# Patient Record
Sex: Female | Born: 1985 | Race: Black or African American | Hispanic: No | Marital: Single | Smoking: Never smoker
Health system: Southern US, Community
[De-identification: ages and names within clinical notes are randomized; demographics above are authoritative.]

---
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
## 2005-08-03 ENCOUNTER — Ambulatory Visit (HOSPITAL_COMMUNITY): Admission: RE | Admit: 2005-08-03 | Discharge: 2005-08-03 | Payer: Self-pay | Admitting: *Deleted

## 2005-08-21 ENCOUNTER — Ambulatory Visit (HOSPITAL_COMMUNITY): Admission: RE | Admit: 2005-08-21 | Discharge: 2005-08-21 | Payer: Self-pay | Admitting: *Deleted

## 2005-08-21 ENCOUNTER — Ambulatory Visit: Payer: Self-pay | Admitting: *Deleted

## 2005-09-27 IMAGING — US US OB COMP +14 WK
1 series · 13 of 28 positions shown · non-contrast
Comparison: none

CLINICAL DATA: 22 week 0 day gestational age by LMP.   Evaluate dating and anatomy.

[Series 1: us ob comp +14 wk · 0.29mm/px · 13 of 94 slices shown]
[im 4/94]
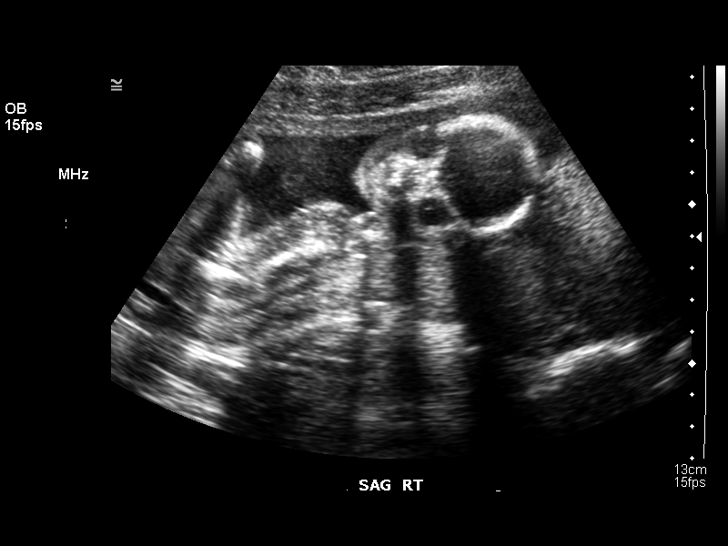
[im 11/94]
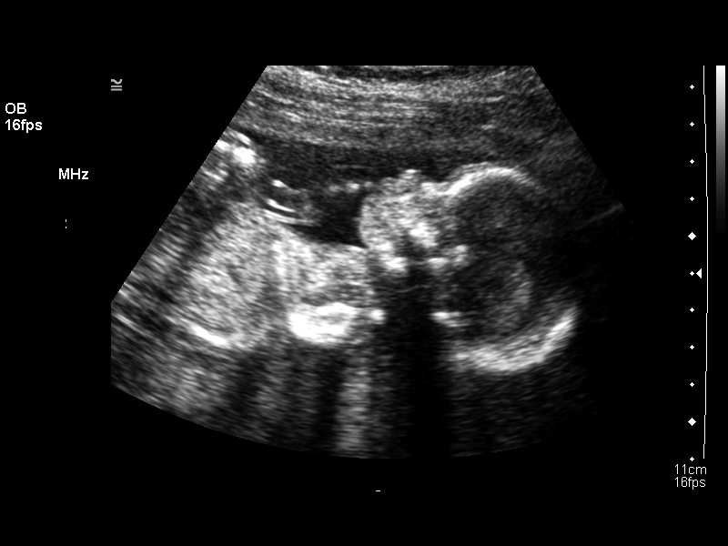
[im 18/94]
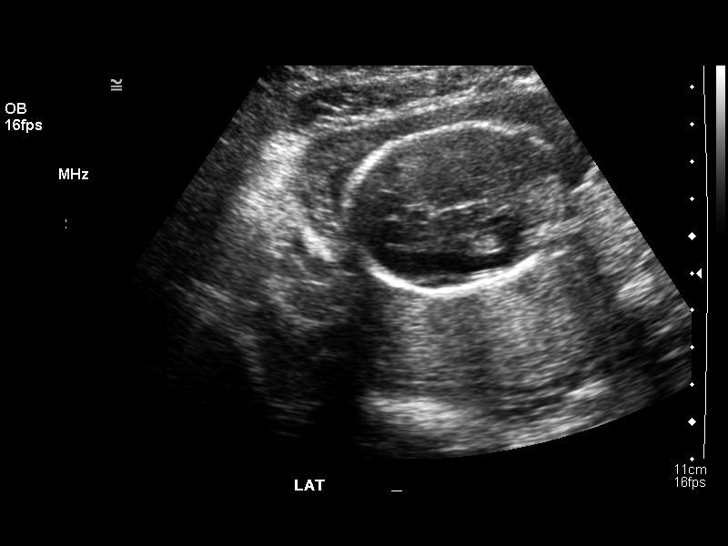
[im 25/94]
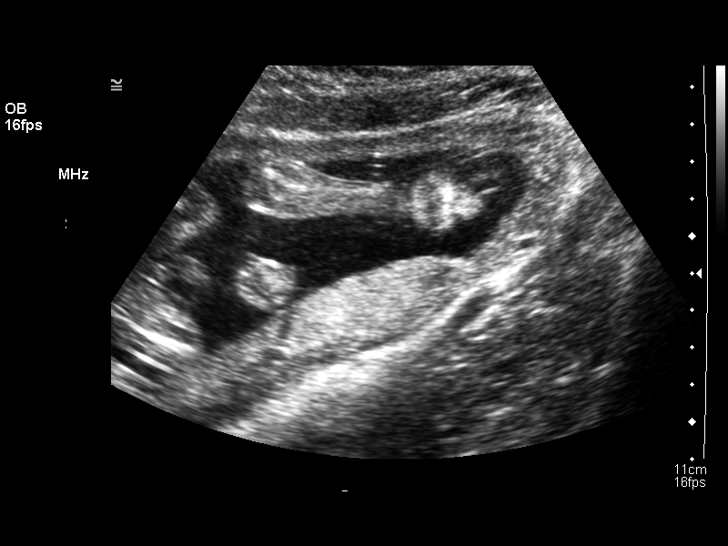
[im 32/94]
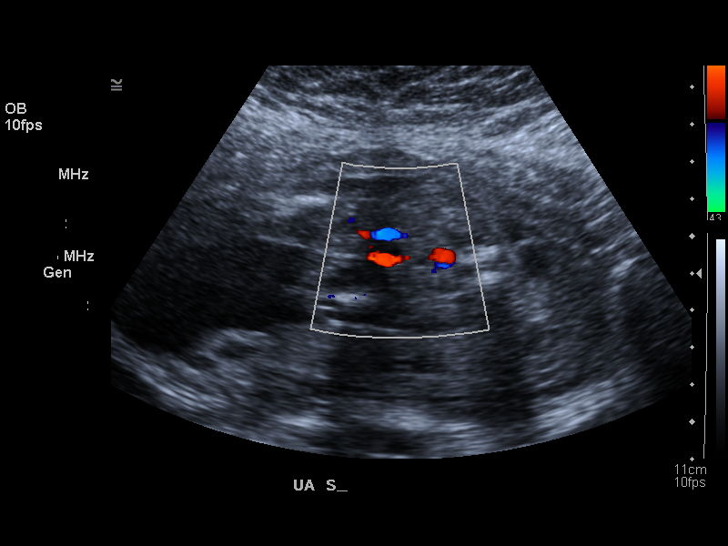
[im 38/94]
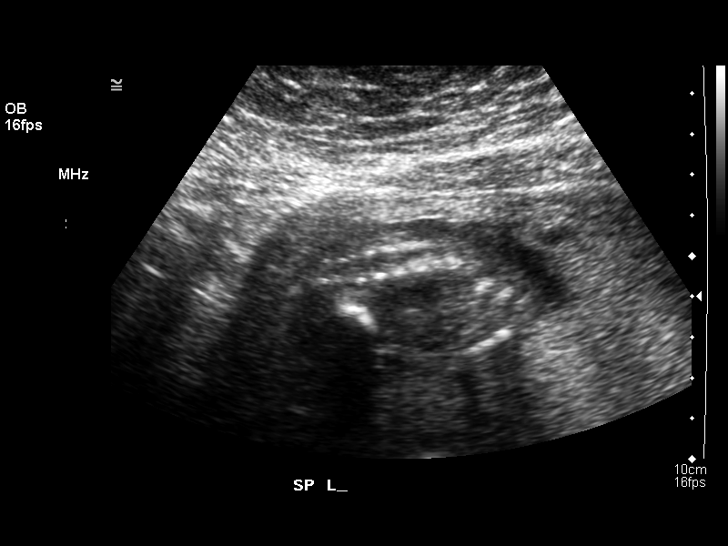
[im 49/94]
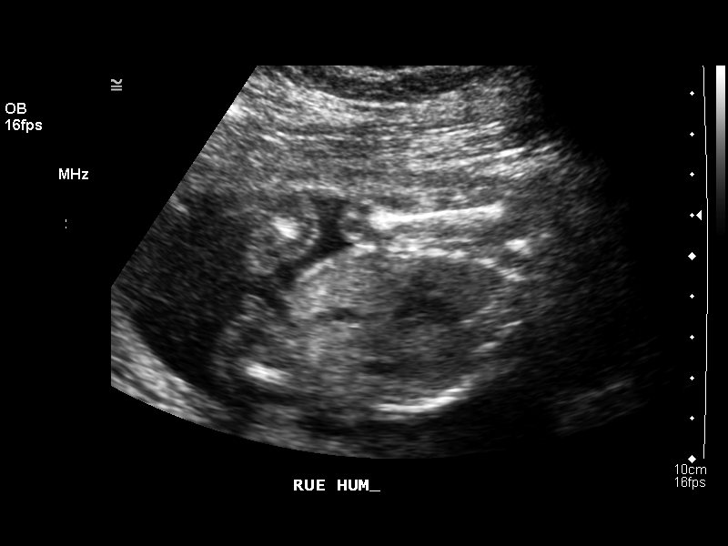
[im 56/94]
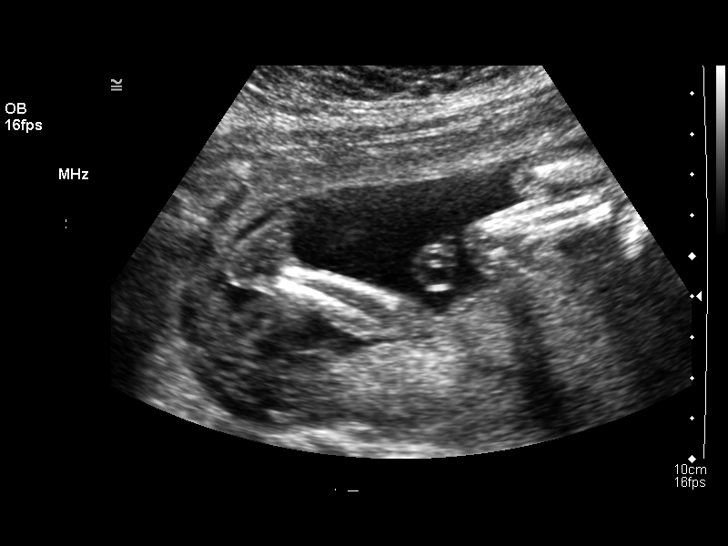
[im 63/94]
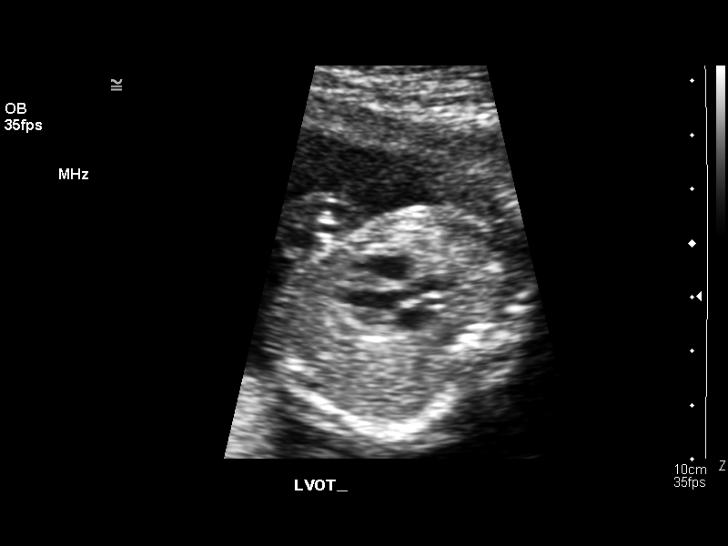
[im 69/94]
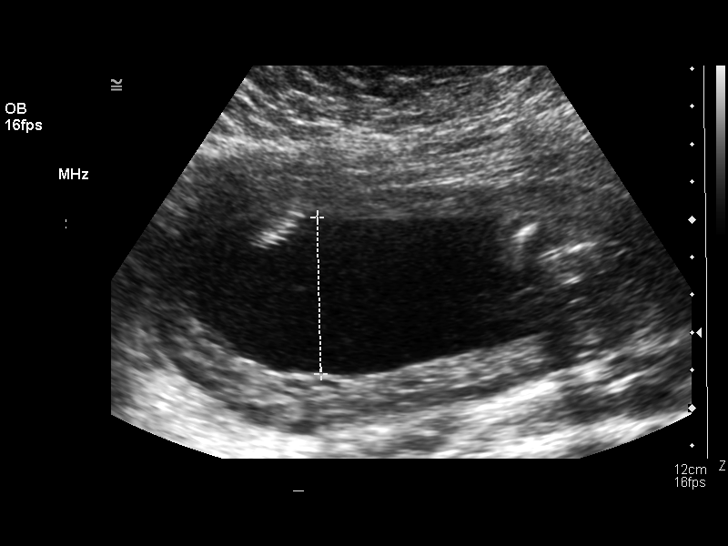
[im 76/94]
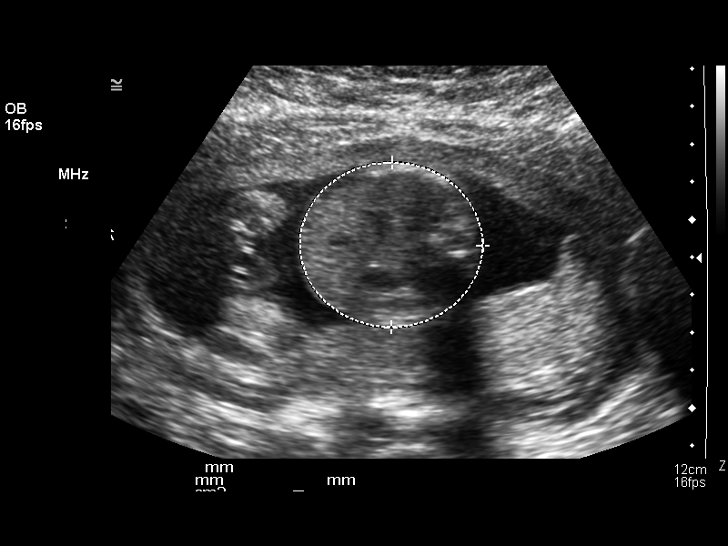
[im 83/94]
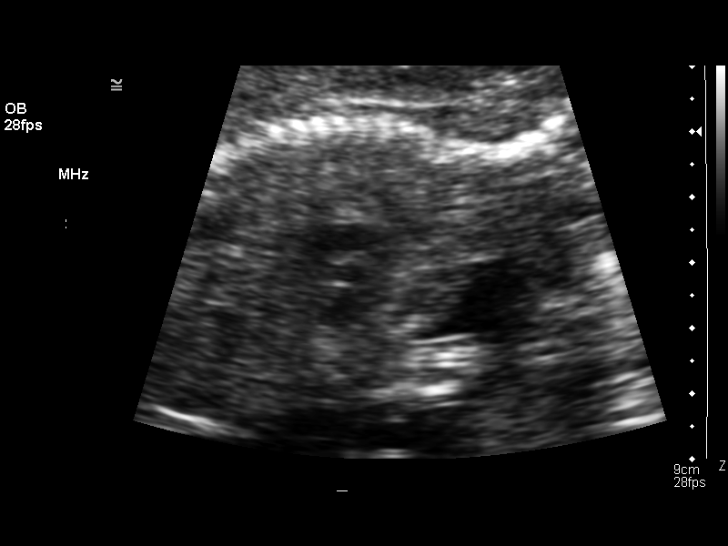
[im 90/94]
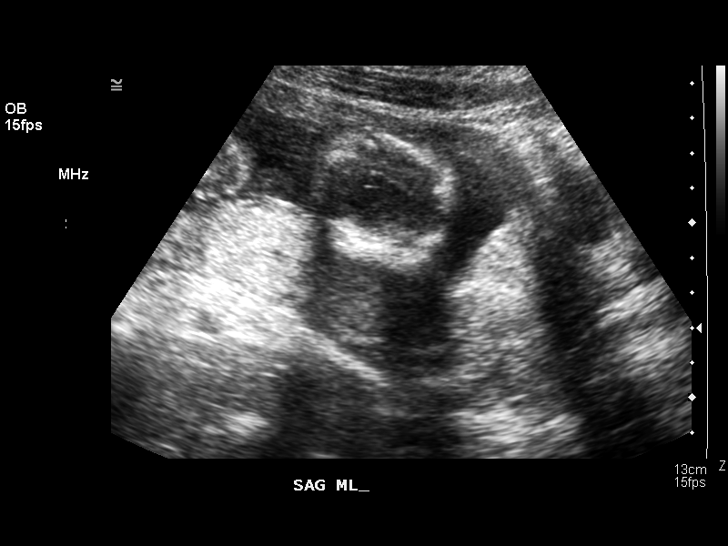

[13 of 28 positions shown; findings below may reference images not displayed]

OBSTETRICAL ULTRASOUND:
Number of Fetuses:  1
Heart Rate:    150 bpm
Movement:  Yes 
Breathing:  
Presentation:  Cephalic 
Placental Location:  Posterior 
Grade:  1
Previa:  No 
Amniotic Fluid (Subjective):  Normal 
Amniotic Fluid (Objective):   Vertical pocket 4.1 cm 

FETAL BIOMETRY
BPD:  4.3 cm  19 w  2 d
HC:  16.8 cm  19 w  3 d
AC:  14.0 cm  19 w  4 d
FL:  3.3 cm  20 w  2 d

MEAN GA:  19 w  5 d 

FETAL ANATOMY
Lateral Ventricles:  Visualized 
Thalami/CSP:  Visualized 
Posterior Fossa:  Visualized 
Nuchal Region:  Visualized 
Spine:  Visualized 
4 Chamber Heart on Left:  Visualized 
Stomach on Left:  Visualized 
3 Vessel Cord:  Visualized 
Cord Insertion site:  Visualized 
Kidneys:  Visualized 
Bladder:  Visualized 
Extremities:  Visualized 

ADDITIONAL ANATOMY VISUALIZED:  LVOT, RVOT, upper lip, orbits, profile, diaphragm, heel, 5th digit, ductal arch, aortic arch, male genitalia and nasal bone

MATERNAL UTERINE AND ADNEXAL FINDINGS
Cervix:   3.7 cm transabdominally 

The right ovary is unremarkable.   Left ovary was not directly visualized but no left adnexal masses are seen.
IMPRESSION: 1.   Single living intrauterine fetus with mean gestational age of 19 weeks 5 days and sonographic EDC of 12/23/05.   This is 2 weeks behind LMP, suggesting LMP is inaccurate.  

2.  No evidence of fetal anatomic abnormality.

## 2005-10-05 ENCOUNTER — Ambulatory Visit (HOSPITAL_COMMUNITY): Admission: RE | Admit: 2005-10-05 | Discharge: 2005-10-05 | Payer: Self-pay | Admitting: *Deleted

## 2005-11-29 IMAGING — US US OB FOLLOW-UP
1 series · 13 of 28 positions shown · non-contrast
Comparison: none

CLINICAL DATA: 28 week 5 day assigned gestational age by prior ultrasound.  Measuring small for dates.  Evaluate fetal growth and amniotic fluid volume.

[Series 1: us ob follow-up · 13 of 54 slices shown]
[im 2/54]
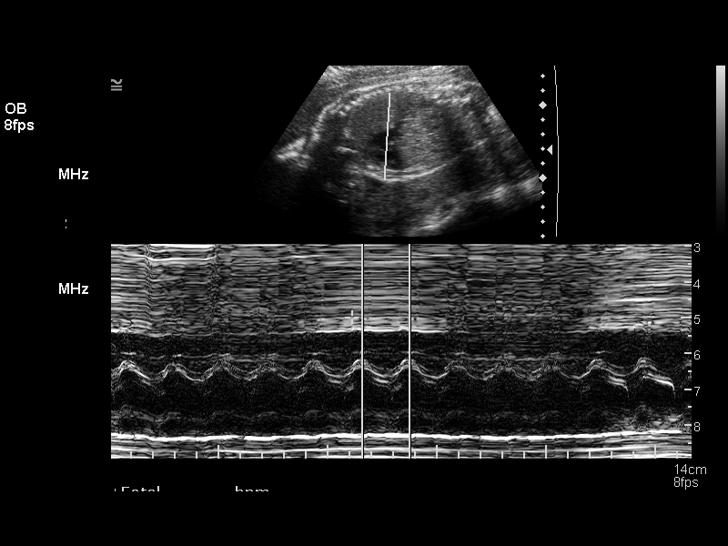
[im 6/54]
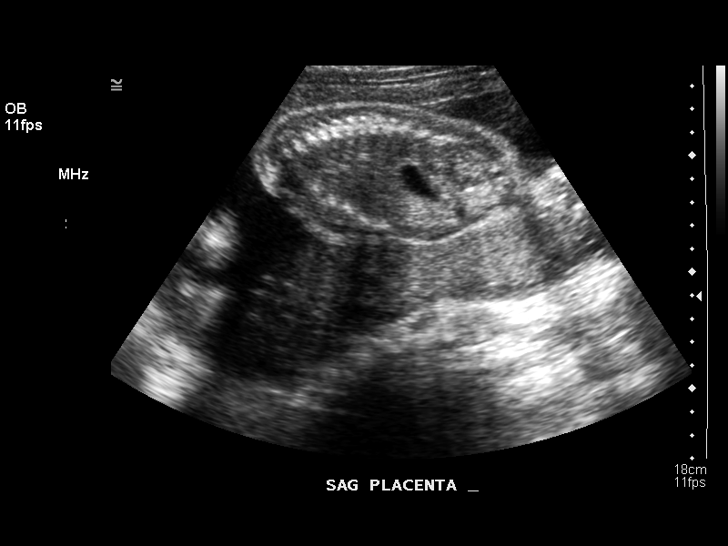
[im 10/54]
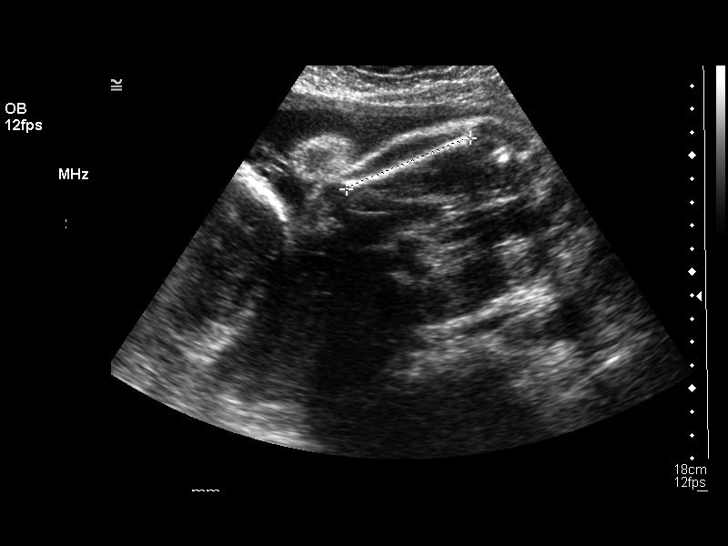
[im 14/54]
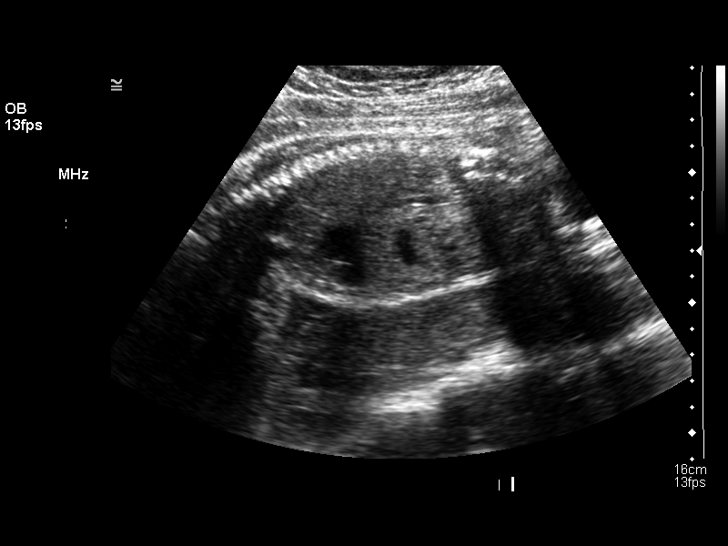
[im 18/54]
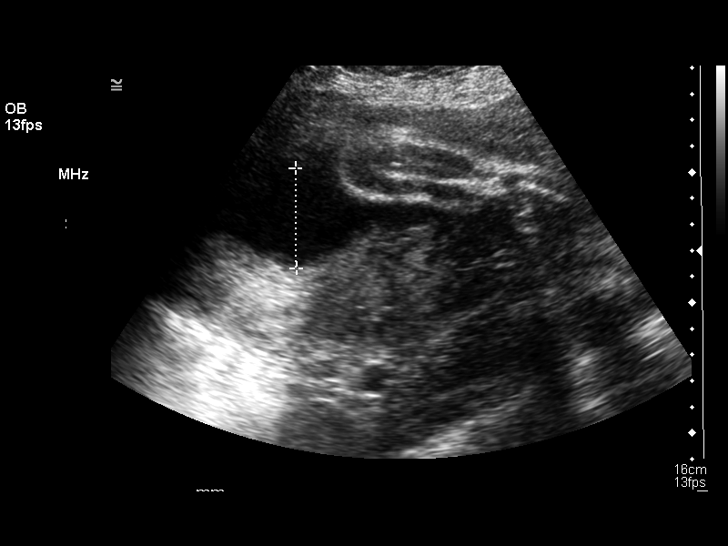
[im 22/54]
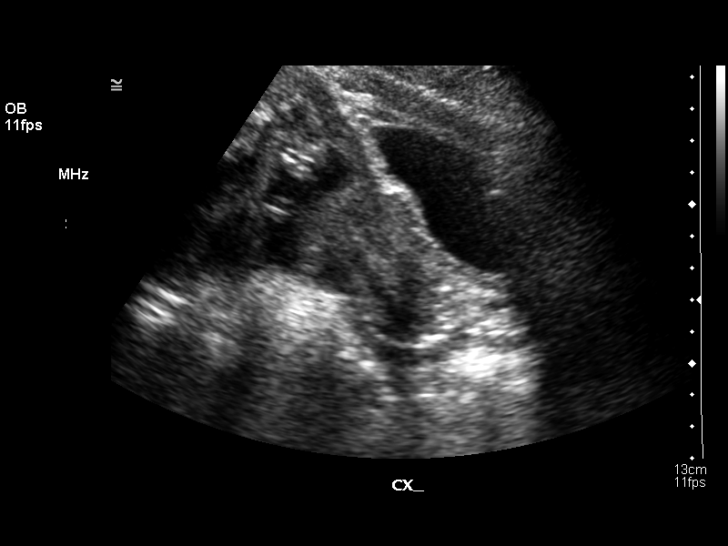
[im 28/54]
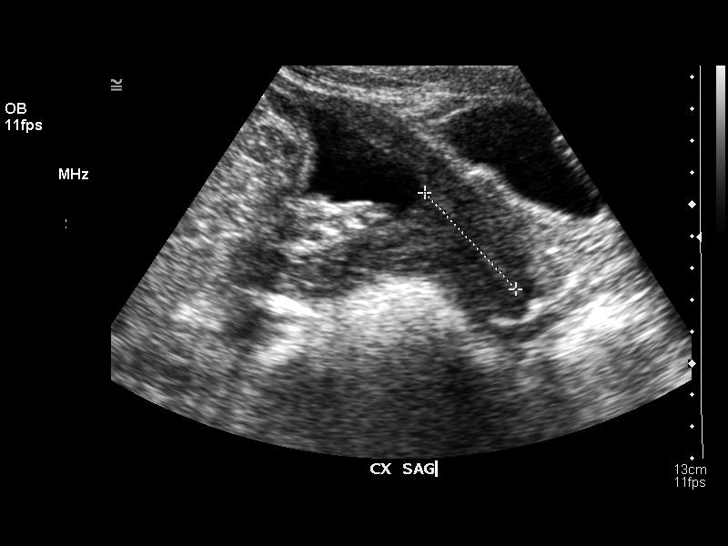
[im 32/54]
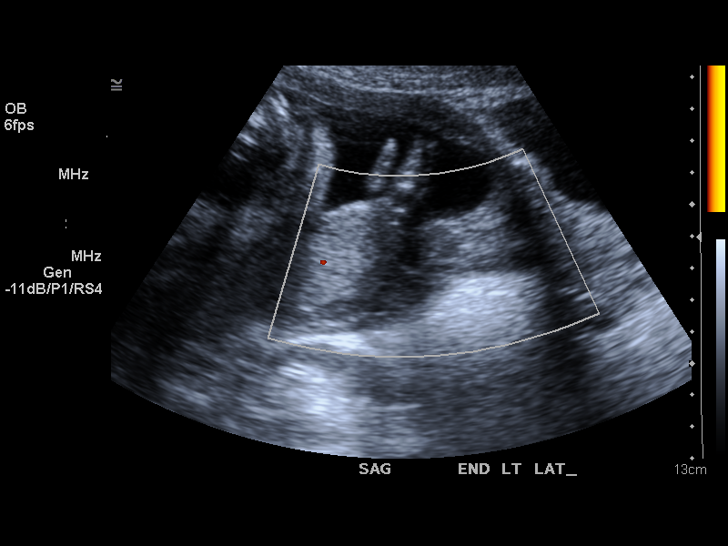
[im 36/54]
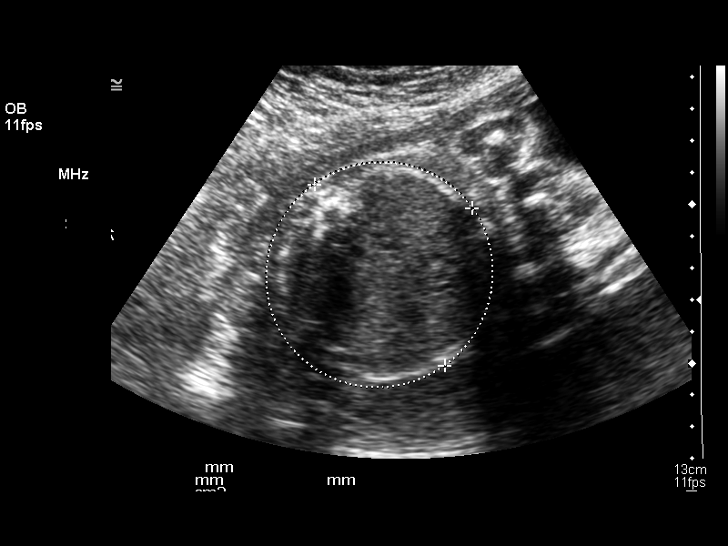
[im 40/54]
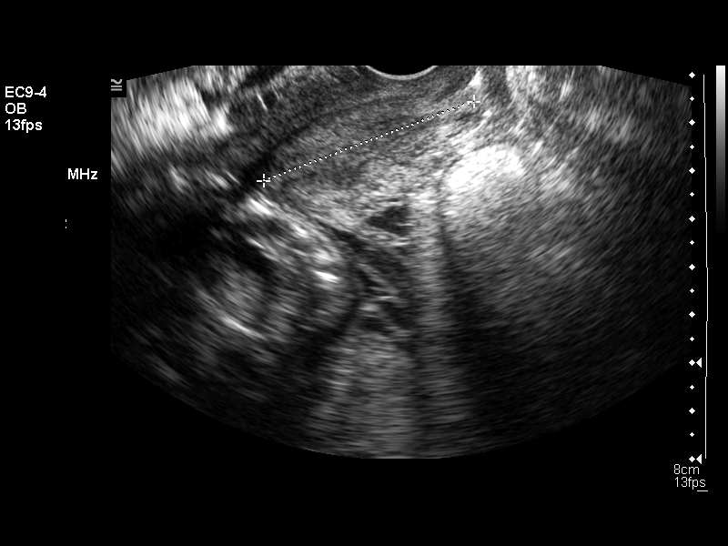
[im 44/54]
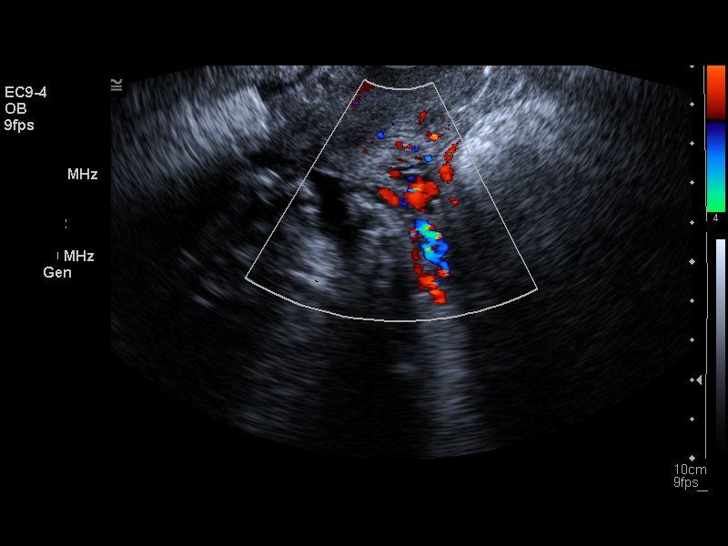
[im 48/54]
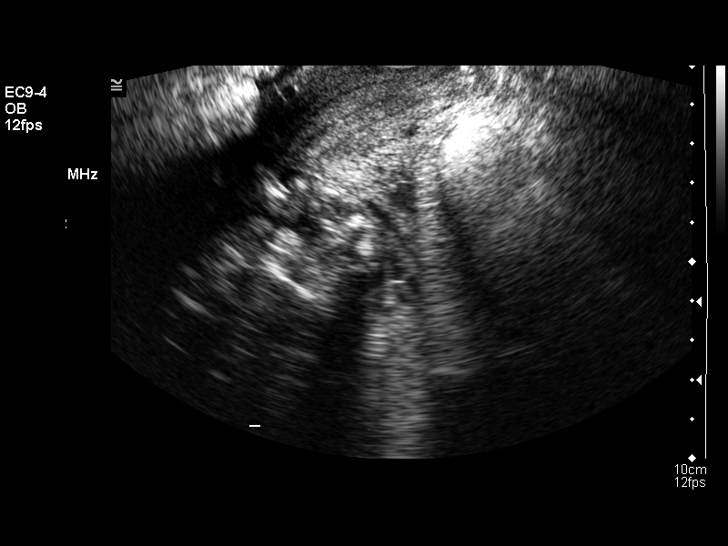
[im 52/54]
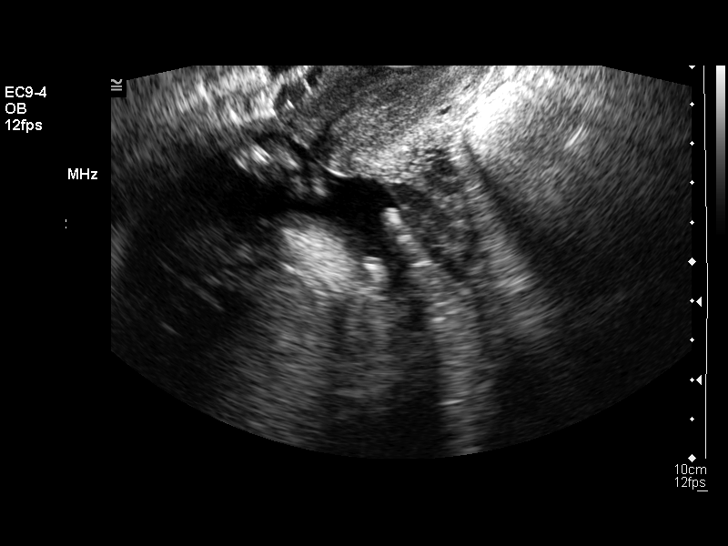

[13 of 28 positions shown; findings below may reference images not displayed]

OBSTETRICAL ULTRASOUND RE-EVALUATION AND TRANSVAGINAL OB US:
 Number of Fetuses:  1
 Heart Rate:  139
 Movement:  Yes
 Breathing:  No
 Presentation:  Breech
 Placental Location:  Posterior
 Grade:  I
 Previa:  No
 Amniotic Fluid (subjective):  Normal
 Amniotic Fluid (objective):  13.3 cm AFI (5th -95th%ile = 9.2 – 23.1 cm for 29 wks)

 FETAL BIOMETRY
 BPD:  7.3 cm   29 w 1 d
 HC:  27.9 cm   30 w 4 d
 AC:  23.1 cm   27 w 3 d
 FL:   5.6 cm  29 w 5 d

 Mean GA:  29 w 2 d
 1st US GA:  28 w 5 d

 EFW:  2272 g (S) 50th – 75th%ile (7726 – 9782 g) For 29 wks

 FETAL ANATOMY
 Lateral Ventricles:  Previously seen 
 Thalami/CSP:  Previously seen 
 Posterior Fossa:  Previously seen 
 Nuchal Region:  Previously seen 
 Spine:  Previously seen 
 4 Chamber Heart on Left:  Previously seen 
 Stomach on Left:  Visualized 
 3 Vessel Cord:  Previously seen 
 Cord Insertion Site:  Previously seen 
 Kidneys:  Visualized 
 Bladder:  Visualized 
 Extremities:  Previously seen 

 MATERNAL UTERINE AND ADNEXAL FINDINGS
 Cervix:  4.2 cm Transvaginally
IMPRESSION: 1.  Assigned gestational age is currently 28 weeks 4 days.  Appropriate fetal growth, with EFW at 50th – 75th percentile.
 2.  Normal amniotic fluid volume.  Normal cervical length.  
 3.  Fetus in breech presentation.

## 2005-12-18 ENCOUNTER — Ambulatory Visit: Payer: Self-pay | Admitting: *Deleted

## 2005-12-18 ENCOUNTER — Inpatient Hospital Stay (HOSPITAL_COMMUNITY): Admission: AD | Admit: 2005-12-18 | Discharge: 2005-12-21 | Payer: Self-pay | Admitting: Family Medicine

## 2005-12-25 ENCOUNTER — Inpatient Hospital Stay (HOSPITAL_COMMUNITY): Admission: AD | Admit: 2005-12-25 | Discharge: 2005-12-25 | Payer: Self-pay | Admitting: Family Medicine

## 2006-01-08 ENCOUNTER — Emergency Department (HOSPITAL_COMMUNITY): Admission: EM | Admit: 2006-01-08 | Discharge: 2006-01-08 | Payer: Self-pay | Admitting: Emergency Medicine

## 2008-04-22 ENCOUNTER — Emergency Department (HOSPITAL_COMMUNITY): Admission: EM | Admit: 2008-04-22 | Discharge: 2008-04-22 | Payer: Self-pay | Admitting: Emergency Medicine

## 2008-09-27 ENCOUNTER — Other Ambulatory Visit: Admission: RE | Admit: 2008-09-27 | Discharge: 2008-09-27 | Payer: Self-pay | Admitting: Family Medicine

## 2011-05-18 NOTE — Op Note (Signed)
Ashlee, Andrews NO.:  [id number]   MEDICAL RECORD NO.:  [id number]          PATIENT TYPE:  INP   LOCATION:  9125                          FACILITY:  [facility]   PHYSICIAN:  Conni Elliot, AndrewsDATE OF BIRTH:  1985-02-05   DATE OF PROCEDURE:  12/18/2005  DATE OF DISCHARGE:                                 OPERATIVE REPORT   PREOPERATIVE DIAGNOSES:  1.  Intrauterine pregnancy 39-1/7 weeks.  2.  Breech presentation.  3.  Premature rupture of membranes.   POSTOPERATIVE DIAGNOSES:  1.  Intrauterine pregnancy 39-1/7 weeks.  2.  Breech presentation.  3.  Premature rupture of membranes.   OPERATION/PROCEDURE:  Primary low transverse cesarean section via  Pfannenstiel.   SURGEON:  Conni Elliot, M.D.   ASSISTANTKaroline Caldwell B. Merlene Andrews, M.D.   ANESTHESIA:  High spinal.  The patient required assistance with respiration.   COMPLICATIONS:  None.   ESTIMATED BLOOD LOSS:  700 mL.   URINARY OUTPUT:  180 mL of clear urine at the end of the procedure.   INDICATIONS:  The patient is a 26 year old G1 at 39-1/7 weeks who presented  with rupture of membranes and was found to have breech presentation.   FINDINGS:  Female infant in vertex presentation.  Pediatric is present at  delivery.  Apgars 2 and 9.  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  Normal tubes and ovaries.  Cord PH 7.07.   DESCRIPTION OF PROCEDURE:  The patient was taken to the operating room where  spinal anesthesia was obtained and found to be adequate.  She was then  prepped and draped in the normal sterile fashion in the dorsal supine  position with a leftward tilt.  A Pfannenstiel skin incision was then made  with the scalpel and carried through the underlying layer of fascia with the  scalpel.  The fascia was incised in the midline and incision extended  laterally with the Mayo scissors.  The superior aspect of the fascial  incision was then grasped with the Kocher clamps, elevated and  the  underlying rectus muscle dissected off bluntly.  Attention was then turned  to the inferior aspect of the incision which in a similar fashion was  grasped, tented up with Kocher clamps and the rectus muscle dissected off  bluntly.  The rectus muscle was then separated in the midline and the  peritoneum identified, tented up and entered sharply with the Metzenbaum  scissors.  The peritoneal incision was extended superiorly and inferiorly  with good visualization of the bladder.  Bladder blade was then inserted and  the vesicouterine peritoneum, identified and grasped with pickups and  entered sharply with the Metzenbaum scissors.  Incision was extended  laterally and the bladder flap created digitally.  The bladder blade was  then reinserted in the lower uterine segment, incised in the transverse  fashion with the scalpel.  The uterus incision was then extended laterally  with the scalpel.  Bladder blade was removed and the infant's head was  delivered atraumatically.  The baby was rotated and the scapula was  identified and arms were delivered all above the head.  The nose and mouth  were bulb-suctioned at the perineum at delivery.  Cord was clamped and cut.  The infant was handed off to the awaiting pediatrician.  Cord gases were  sent.  Cord gas was PH 7.07, CO2 89, PO2 8, bicarb 24.8, total CO2 27.5,  base deficit -8.4.  Placenta was then removed spontaneously. The uterus was  exteriorized and cleared of all clots and debris.  The uterine incision was  repaired in a running locked fashion.  Second layer of the same suture was  used to obtain excellent hemostasis.  The bladder flap was repaired in a  running fashion and uterus returned to the abdomen.  The gutters were  cleared off of all clots and peritoneum closed.  The fascia was  reapproximated  in a running fashion.  Subcutaneous tissue was then  reapproximated and the skin was closed with staples.  The patient tolerated  the  procedure well.  Sponge, lap and needle counts were correct x2.  The  patient was taken to the recovery room in stable condition.     ______________________________  Ashlee Andrews    ______________________________  Conni Elliot, M.D.    ABC/MEDQ  D:  12/18/2005  T:  12/19/2005  Job:  [id number]

## 2011-05-18 NOTE — Discharge Summary (Signed)
NAMEJAMYE, BALICKI NO.:  [id number]   MEDICAL RECORD NO.:  [id number]          PATIENT TYPE:  INP   LOCATION:  9125                          FACILITY:  [facility]   PHYSICIAN:  Towana Badger, M.D.       DATE OF BIRTH:  1985/12/05   DATE OF ADMISSION:  12/18/2005  DATE OF DISCHARGE:  12/21/2005                                 DISCHARGE SUMMARY   REASON FOR ADMISSION:  Onset of labor.   PROCEDURES:  Prenatal ultrasound, intrapartum low transverse Cesarean  section.  Postpartum:  None.   COMPLICATIONS:  Operative and postpartum:  None.   DISCHARGE DIAGNOSES:  Term pregnancy, delivered.   HOSPITAL COURSE:  The patient is a 26 year old G1 who presented at 39-1/7  weeks with spontaneous rupture of membranes.  The spontaneous rupture of  membranes was confirmed in the MAU.  The patient was admitted and found to  have breech presentation and taken to the operating room for Cesarean  section.  The patient delivered a female infant, Apgar's of 9 and 9.  Estimated blood loss less than 700 mL.  The patient and infant tolerated  procedure well.  The patient had a high spinal anesthesia.  Postoperative  routine hospital course.  Patient is B positive, negative antibody, Rubella  immune, GBS negative.   DISCHARGE INFORMATION:  Discharge date December 21, 2005 at 11:00 A.M.  Discharge hematocrit 24.2, discharge hemoglobin 8.0, unrestricted activity.  Routine diet.   DISCHARGE MEDICATIONS:  1.  Percocet 5/325 one to two tablets q.4h. PRN, dispense #20.  2.  Ibuprofen 600 mg q.6h. PRN pain.  3.  Prenatal vitamins while breastfeeding.   DISCHARGE INSTRUCTIONS:  Patient is instructed to follow up in the MAU  within 3 days for removal of staples and to follow up at [facility] in  six weeks for routine postpartum care.      Towana Badger, M.D.     JP/MEDQ  D:  12/21/2005  T:  12/22/2005  Job:  [id number]

## 2011-09-25 LAB — RAPID STREP SCREEN (MED CTR MEBANE ONLY): Streptococcus, Group A Screen (Direct): NEGATIVE

## 2014-07-19 ENCOUNTER — Emergency Department (HOSPITAL_COMMUNITY): Payer: 59

## 2014-07-19 ENCOUNTER — Encounter (HOSPITAL_COMMUNITY): Payer: Self-pay | Admitting: Emergency Medicine

## 2014-07-19 DIAGNOSIS — R5381 Other malaise: Secondary | ICD-10-CM | POA: Insufficient documentation

## 2014-07-19 DIAGNOSIS — R599 Enlarged lymph nodes, unspecified: Secondary | ICD-10-CM | POA: Insufficient documentation

## 2014-07-19 DIAGNOSIS — R51 Headache: Secondary | ICD-10-CM | POA: Insufficient documentation

## 2014-07-19 DIAGNOSIS — J069 Acute upper respiratory infection, unspecified: Secondary | ICD-10-CM | POA: Insufficient documentation

## 2014-07-19 DIAGNOSIS — R5383 Other fatigue: Secondary | ICD-10-CM

## 2014-07-19 DIAGNOSIS — J309 Allergic rhinitis, unspecified: Secondary | ICD-10-CM | POA: Insufficient documentation

## 2014-07-19 NOTE — ED Notes (Signed)
Pt c/o generalized body aches, nasal congestion, cough with chest discomfort, and headache for two days. Pt took Zyrtec without relief.

## 2014-07-20 ENCOUNTER — Emergency Department (HOSPITAL_COMMUNITY)
Admission: EM | Admit: 2014-07-20 | Discharge: 2014-07-20 | Disposition: A | Discharge status: Home / Self Care | Payer: 59 | Attending: Emergency Medicine | Admitting: Emergency Medicine

## 2014-07-20 DIAGNOSIS — J069 Acute upper respiratory infection, unspecified: Secondary | ICD-10-CM

## 2014-07-20 MED ORDER — FLUTICASONE PROPIONATE 50 MCG/ACT NA SUSP: 2.0000 | Freq: Every day | NASAL | Status: AC

## 2014-07-20 MED ORDER — NAPROXEN 500 MG PO TABS: 500.0000 mg | ORAL_TABLET | Freq: Two times a day (BID) | ORAL | Status: AC | PRN

## 2014-07-20 NOTE — ED Provider Notes (Signed)
CSN: [id number]     Arrival date & time 07/19/14  2130 History   First MD Initiated Contact with Patient 07/20/14 0143     Chief Complaint  Patient presents with  . URI     (Consider location/radiation/quality/duration/timing/severity/associated sxs/prior Treatment) HPI Comments: Ashlee Andrews is a 29 y.o. female diagnosis significant past medical history, presenting to the ED today with complaint of sinus congestion, frontal headache, runny nose, sore throat, body aches, LAD, and cough for the last 2 days. States that her son was recently sick, but got over it within one day. States that she has tried Writer, as well as drinking lots of fluids and getting plenty of rest. She states that she does not like taking medications. Describes that she is coughing up a whitish colored sputum, and she has not looked at her nasal secretions. States that she does have a some outdoor allergies. Does not smoke. States that she had subjective fevers, but did not take her temperature. Her headache is behind her eyes, and she denies photophobia. Denies any abdominal pain, nausea, vomiting, diarrhea, , arthralgias, eye pain, watery eyes, ear pain or discharge, chest pain, shortness of breath, wheezing, paresthesias. Denies any drooling, trismus, muffled voice.  Patient is a 29 y.o. female presenting with URI. The history is provided by the patient. No language interpreter was used.  URI Presenting symptoms: congestion, cough, facial pain, fatigue, fever (subjective), rhinorrhea and sore throat   Presenting symptoms: no ear pain   Severity:  Moderate Onset quality:  Sudden Duration:  2 days Timing:  Constant Progression:  Unchanged Chronicity:  New Relieved by:  Nothing Worsened by:  Nothing tried Ineffective treatments:  Drinking and rest Associated symptoms: headaches, myalgias, sinus pain and swollen glands   Associated symptoms: no arthralgias, no neck pain, no sneezing and no wheezing    Risk factors: sick contacts     History reviewed. No pertinent past medical history. Past Surgical History  Procedure Laterality Date  . Cesarean section     History reviewed. No pertinent family history. History  Substance Use Topics  . Smoking status: Never Smoker   . Smokeless tobacco: Never Used  . Alcohol Use: Yes   OB History   Grav Para Term Preterm Abortions TAB SAB Ect Mult Living                 Review of Systems  Constitutional: Positive for fever (subjective) and fatigue.  HENT: Positive for congestion, rhinorrhea, sinus pressure and sore throat. Negative for drooling, ear discharge, ear pain, facial swelling, hearing loss, nosebleeds, postnasal drip, sneezing, trouble swallowing and voice change.   Respiratory: Positive for cough. Negative for chest tightness, shortness of breath and wheezing.   Cardiovascular: Negative for chest pain and palpitations.  Gastrointestinal: Negative for nausea, vomiting, abdominal pain and diarrhea.  Genitourinary: Negative for dysuria and hematuria.  Musculoskeletal: Positive for myalgias. Negative for arthralgias, back pain, joint swelling, neck pain and neck stiffness.  Skin: Negative for rash.  Allergic/Immunologic: Positive for environmental allergies.  Neurological: Positive for headaches. Negative for dizziness, syncope, weakness, light-headedness and numbness.  Hematological: Positive for adenopathy.  Psychiatric/Behavioral: Negative for confusion.  10 Systems reviewed and are negative for acute change except as noted in the HPI.     Allergies  [place]  Home Medications   Prior to Admission medications   Medication Sig Start Date End Date Taking? Authorizing Provider  fluticasone (FLONASE) 50 MCG/ACT nasal spray Place 2 sprays into both nostrils  daily. 07/20/14   Donnita Falls Camprubi-Soms, PA-C  naproxen (NAPROSYN) 500 MG tablet Take 1 tablet (500 mg total) by mouth 2 (two) times daily as needed for mild pain,  moderate pain or headache (TAKE WITH MEALS.). 07/20/14   Abubakar Crispo Strupp Camprubi-Soms, PA-C   BP 106/66  Pulse 79  Temp(Src) 98.7 F (37.1 C) (Oral)  Resp 18  Ht 4\' 11"  (1.499 m)  Wt 184 lb (83.462 kg)  BMI 37.14 kg/m2  SpO2 100%  LMP 07/08/2014 Physical Exam  Nursing note and vitals reviewed. Constitutional: She is oriented to person, place, and time. Vital signs are normal. She appears well-developed and well-nourished. No distress.  Afebrile, nontoxic.  HENT:  Head: Normocephalic and atraumatic.  Right Ear: Hearing, external ear and ear canal normal. No drainage, swelling or tenderness. Tympanic membrane is not injected and not erythematous. A middle ear effusion (clear) is present.  Left Ear: Hearing, external ear and ear canal normal. No drainage, swelling or tenderness. Tympanic membrane is not injected and not erythematous. A middle ear effusion (clear) is present.  Nose: Mucosal edema and rhinorrhea present. Right sinus exhibits maxillary sinus tenderness and frontal sinus tenderness. Left sinus exhibits maxillary sinus tenderness and frontal sinus tenderness.  Mouth/Throat: Uvula is midline and mucous membranes are normal. No trismus in the jaw. No uvula swelling. Posterior oropharyngeal edema and posterior oropharyngeal erythema present. No oropharyngeal exudate or tonsillar abscesses.  [place]/AT. Bilateral ears nontender to palpation, external ear or canal clear bilaterally, with no drainage. Bilateral TMs with no injection or erythema. Bilateral clear middle ear effusions. No loss of bony landmarks. Bilateral nasal turbinates edematous and erythematous, with clear rhinorrhea. Posterior oropharynx with 3+ tonsils bilaterally, mild erythema, no exudates, no peritonsillar abscess. No trismus or uvular swelling. Mucous membranes moist  Eyes: Conjunctivae, EOM and lids are normal. Pupils are equal, round, and reactive to light. Right eye exhibits no discharge. Left eye exhibits no  discharge.  Neck: Normal range of motion. Neck supple.  Cardiovascular: Normal rate, regular rhythm, normal heart sounds and intact distal pulses.  Exam reveals no gallop and no friction rub.   No murmur heard. RRR, nl s1/s2, no m/r/g, distal pulses intact.   Pulmonary/Chest: Effort normal and breath sounds normal. No respiratory distress. She has no decreased breath sounds. She has no wheezes. She has no rhonchi. She has no rales.  CTAB in all fields, no w/r/r. Oxygen saturations 99% on RA, no resp distress  Abdominal: Normal appearance. She exhibits no distension.  Musculoskeletal: Normal range of motion.  Lymphadenopathy:       Head (right side): Submental, submandibular and tonsillar adenopathy present. No preauricular, no posterior auricular and no occipital adenopathy present.       Head (left side): Submental, submandibular and tonsillar adenopathy present. No preauricular, no posterior auricular and no occipital adenopathy present.    She has cervical adenopathy.       Right cervical: Superficial cervical adenopathy present. No deep cervical and no posterior cervical adenopathy present.      Left cervical: Superficial cervical adenopathy present. No deep cervical and no posterior cervical adenopathy present.  Tender anterior cervical lymphadenopathy superficially, bilaterally. No deep cervical lymph nodes, no posterior cervical lymph nodes. Submental, submandibular, tonsillar lymph nodes tender to palpation, and present bilaterally  Neurological: She is alert and oriented to person, place, and time. She has normal strength. No cranial nerve deficit or sensory deficit.  Sensation grossly intact. Strength 5/5 extremities. Cranial nerves III-XII grossly intact  Skin: Skin  is warm, dry and intact. No rash noted.  Psychiatric: She has a normal mood and affect.    ED Course  Procedures (including critical care time) Labs Review Labs Reviewed - No data to display  Imaging Review Dg Chest  2 View  07/19/2014   CLINICAL DATA:  Chest pain today.  Nonsmoker.  EXAM: CHEST  2 VIEW  COMPARISON:  None.  FINDINGS: The heart size and mediastinal contours are normal. The lungs are clear. There is no pleural effusion or pneumothorax. No acute osseous findings are identified.  IMPRESSION: No active cardiopulmonary process.   Electronically Signed   By: Roxy HorsemanBill  Veazey M.D.   On: 07/19/2014 23:20     EKG Interpretation None      MDM   Final diagnoses:  URI (upper respiratory infection)    Ashlee Andrews is a 29 y.o. female no significant past medical history, presented today with 2 days of URI symptoms. Pt is afebrile and nontoxic appearing. Clear lung exam. Mild rhinorrhea, edematous nasal turbinates. CENTOR criteria low, will not test for strep. Likely viral URI. Chest x-ray obtained in triage was negative. Low clinical suspicion for pneumonia. Rx for flonase and naproxen. Discussed neti pot, lots of fluids and rest, and chloraseptic spray. Pt agreeable to symptomatic treatment with close follow up with PCP as needed but spoke at length about emergent changing or worsening of symptoms that should prompt return to ER. Pt voices understanding and is agreeable to plan.   BP 106/66  Pulse 79  Temp(Src) 98.7 F (37.1 C) (Oral)  Resp 18  Ht 4\' 11"  (1.499 m)  Wt 184 lb (83.462 kg)  BMI 37.14 kg/m2  SpO2 100%  LMP 07/08/2014    Donnita FallsMercedes Strupp Camprubi-Soms, PA-C 07/20/14 [phone]

## 2014-07-20 NOTE — Discharge Instructions (Signed)
Continue to stay well-hydrated. Gargle warm salt water and spit it out. Use chloraseptic spray for throat relief. Use neti pot for sinus congestion relief. Use flonase as directed for sinus congestion, leaning forward when you instill the spray in your nostrils. Continue to alternate between Tylenol and Naprosyn for pain or fever. May consider over-the-counter Benadryl for additional relief. Followup with your primary care doctor in 5-7 days for recheck of ongoing symptoms that return to emergency department for emergent changing or worsening of symptoms. Take benadryl or other antihistamine to decrease secretions and for watery itchy eyes.  Use Mucinex for cough suppression/expectoration of mucus.    Upper Respiratory Infection, Adult An upper respiratory infection (URI) is also sometimes known as the common cold. The upper respiratory tract includes the nose, sinuses, throat, trachea, and bronchi. Bronchi are the airways leading to the lungs. Most people improve within 1 week, but symptoms can last up to 2 weeks. A residual cough may last even longer.  CAUSES Many different viruses can infect the tissues lining the upper respiratory tract. The tissues become irritated and inflamed and often become very moist. Mucus production is also common. A cold is contagious. You can easily spread the virus to others by oral contact. This includes kissing, sharing a glass, coughing, or sneezing. Touching your mouth or nose and then touching a surface, which is then touched by another person, can also spread the virus. SYMPTOMS  Symptoms typically develop 1 to 3 days after you come in contact with a cold virus. Symptoms vary from person to person. They may include:  Runny nose.  Sneezing.  Nasal congestion.  Sinus irritation.  Sore throat.  Loss of voice (laryngitis).  Cough.  Fatigue.  Muscle aches.  Loss of appetite.  Headache.  Low-grade fever. DIAGNOSIS  You might diagnose your own cold  based on familiar symptoms, since most people get a cold 2 to 3 times a year. Your caregiver can confirm this based on your exam. Most importantly, your caregiver can check that your symptoms are not due to another disease such as strep throat, sinusitis, pneumonia, asthma, or epiglottitis. Blood tests, throat tests, and X-rays are not necessary to diagnose a common cold, but they may sometimes be helpful in excluding other more serious diseases. Your caregiver will decide if any further tests are required. RISKS AND COMPLICATIONS  You may be at risk for a more severe case of the common cold if you smoke cigarettes, have chronic heart disease (such as heart failure) or lung disease (such as asthma), or if you have a weakened immune system. The very young and very old are also at risk for more serious infections. Bacterial sinusitis, middle ear infections, and bacterial pneumonia can complicate the common cold. The common cold can worsen asthma and chronic obstructive pulmonary disease (COPD). Sometimes, these complications can require emergency medical care and may be life-threatening. PREVENTION  The best way to protect against getting a cold is to practice good hygiene. Avoid oral or hand contact with people with cold symptoms. Wash your hands often if contact occurs. There is no clear evidence that vitamin C, vitamin E, echinacea, or exercise reduces the chance of developing a cold. However, it is always recommended to get plenty of rest and practice good nutrition. TREATMENT  Treatment is directed at relieving symptoms. There is no cure. Antibiotics are not effective, because the infection is caused by a virus, not by bacteria. Treatment may include:  Increased fluid intake. Sports drinks offer valuable  electrolytes, sugars, and fluids.  Breathing heated mist or steam (vaporizer or shower).  Eating chicken soup or other clear broths, and maintaining good nutrition.  Getting plenty of rest.  Using  gargles or lozenges for comfort.  Controlling fevers with ibuprofen or acetaminophen as directed by your caregiver.  Increasing usage of your inhaler if you have asthma. Zinc gel and zinc lozenges, taken in the first 24 hours of the common cold, can shorten the duration and lessen the severity of symptoms. Pain medicines may help with fever, muscle aches, and throat pain. A variety of non-prescription medicines are available to treat congestion and runny nose. Your caregiver can make recommendations and may suggest nasal or lung inhalers for other symptoms.  HOME CARE INSTRUCTIONS   Only take over-the-counter or prescription medicines for pain, discomfort, or fever as directed by your caregiver.  Use a warm mist humidifier or inhale steam from a shower to increase air moisture. This may keep secretions moist and make it easier to breathe.  Drink enough water and fluids to keep your urine clear or pale yellow.  Rest as needed.  Return to work when your temperature has returned to normal or as your caregiver advises. You may need to stay home longer to avoid infecting others. You can also use a face mask and careful hand washing to prevent spread of the virus. SEEK MEDICAL CARE IF:   After the first few days, you feel you are getting worse rather than better.  You need your caregiver's advice about medicines to control symptoms.  You develop chills, worsening shortness of breath, or brown or red sputum. These may be signs of pneumonia.  You develop yellow or brown nasal discharge or pain in the face, especially when you bend forward. These may be signs of sinusitis.  You develop a fever, swollen neck glands, pain with swallowing, or white areas in the back of your throat. These may be signs of strep throat. SEEK IMMEDIATE MEDICAL CARE IF:   You have a fever.  You develop severe or persistent headache, ear pain, sinus pain, or chest pain.  You develop wheezing, a prolonged cough, cough  up blood, or have a change in your usual mucus (if you have chronic lung disease).  You develop sore muscles or a stiff neck. Document Released: 06/12/2001 Document Revised: 03/10/2012 Document Reviewed: 04/20/2011 [facility] Patient Information 2015 [place]. This information is not intended to replace advice given to you by your health care provider. Make sure you discuss any questions you have with your health care provider.

## 2014-07-21 NOTE — ED Provider Notes (Signed)
Medical screening examination/treatment/procedure(s) were performed by non-physician practitioner and as supervising physician I was immediately available for consultation/collaboration.    Vida RollerBrian D Mynor Witkop, MD 07/21/14 [phone]

## 2014-09-12 IMAGING — CR DG CHEST 2V
2 series · 2 of 2 positions shown · non-contrast
Comparison: None.

CLINICAL DATA: Chest pain today.  Nonsmoker.

EXAM:
CHEST  2 VIEW

[w chest pa]
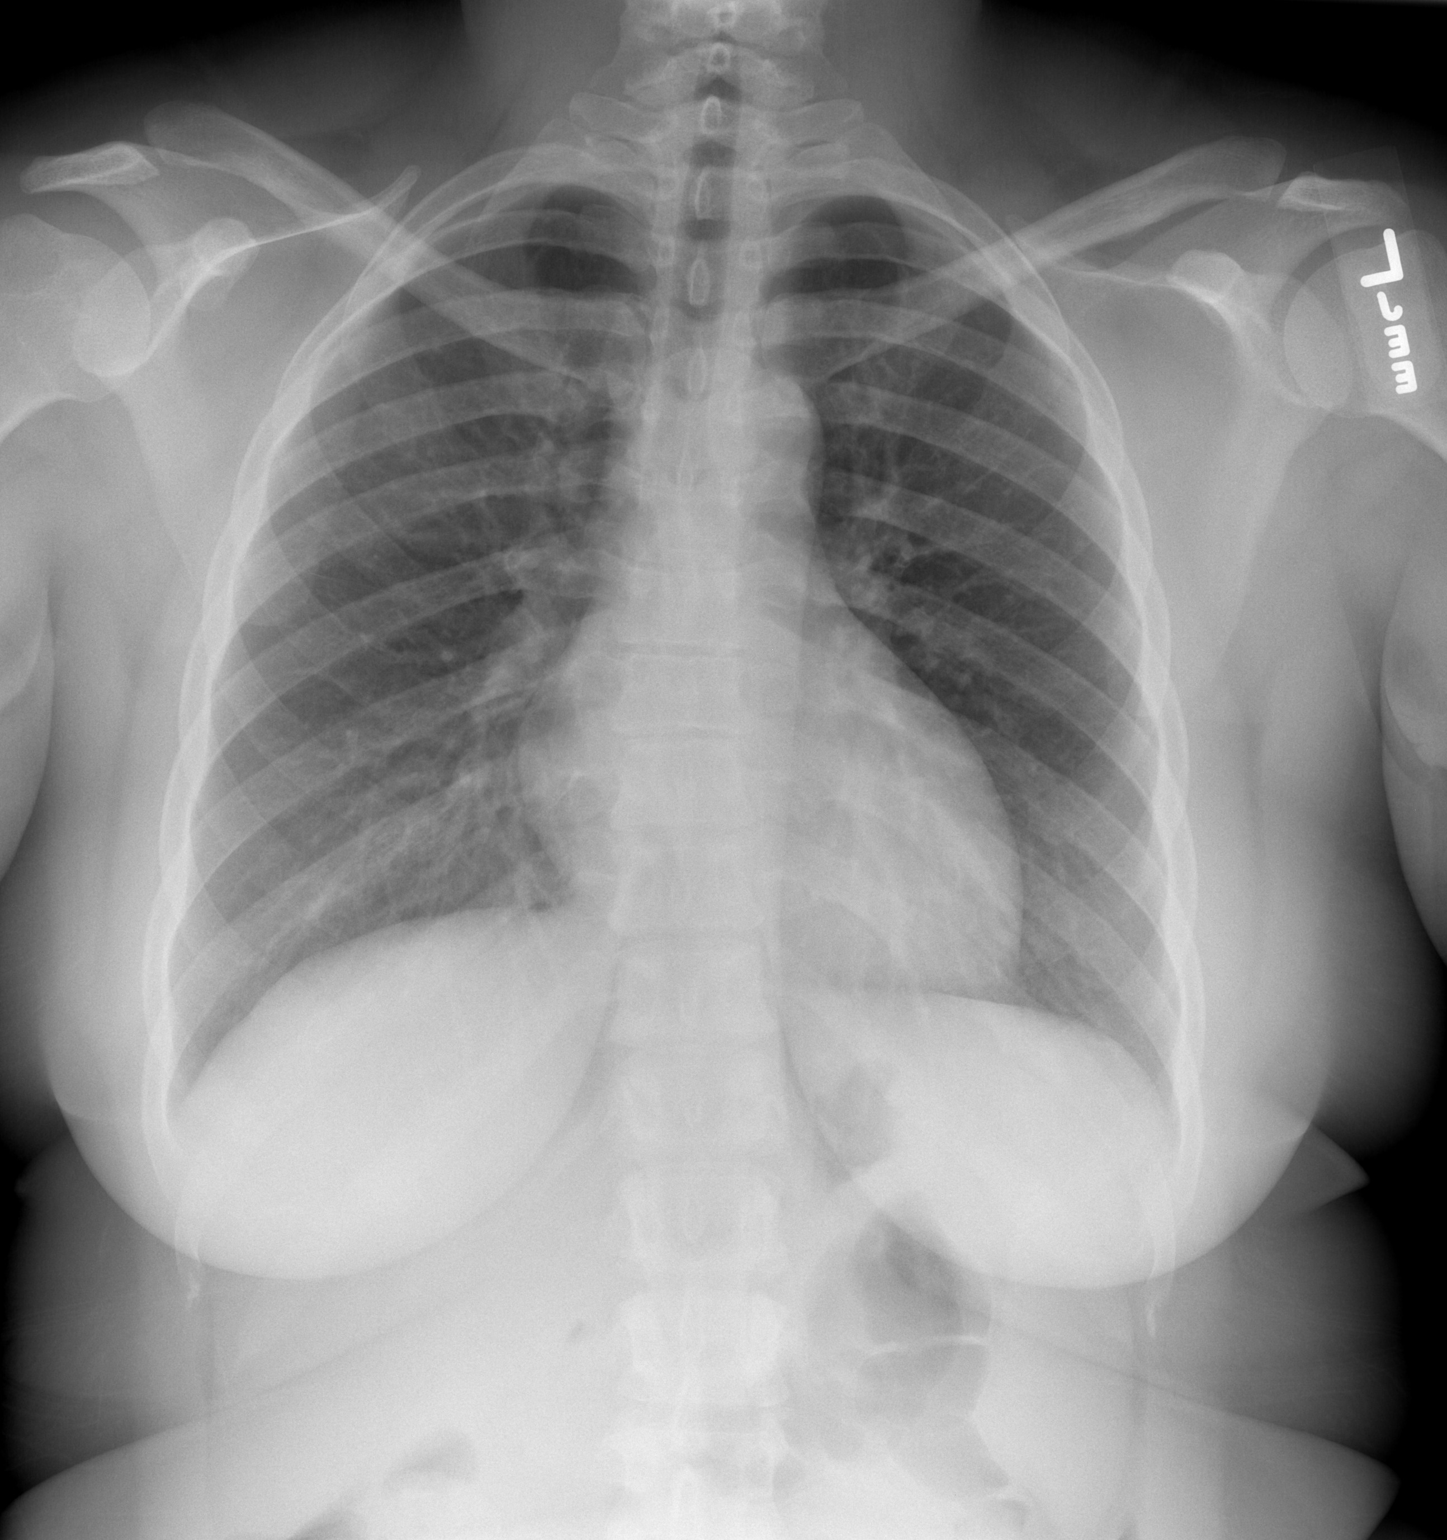

[w chest lat]
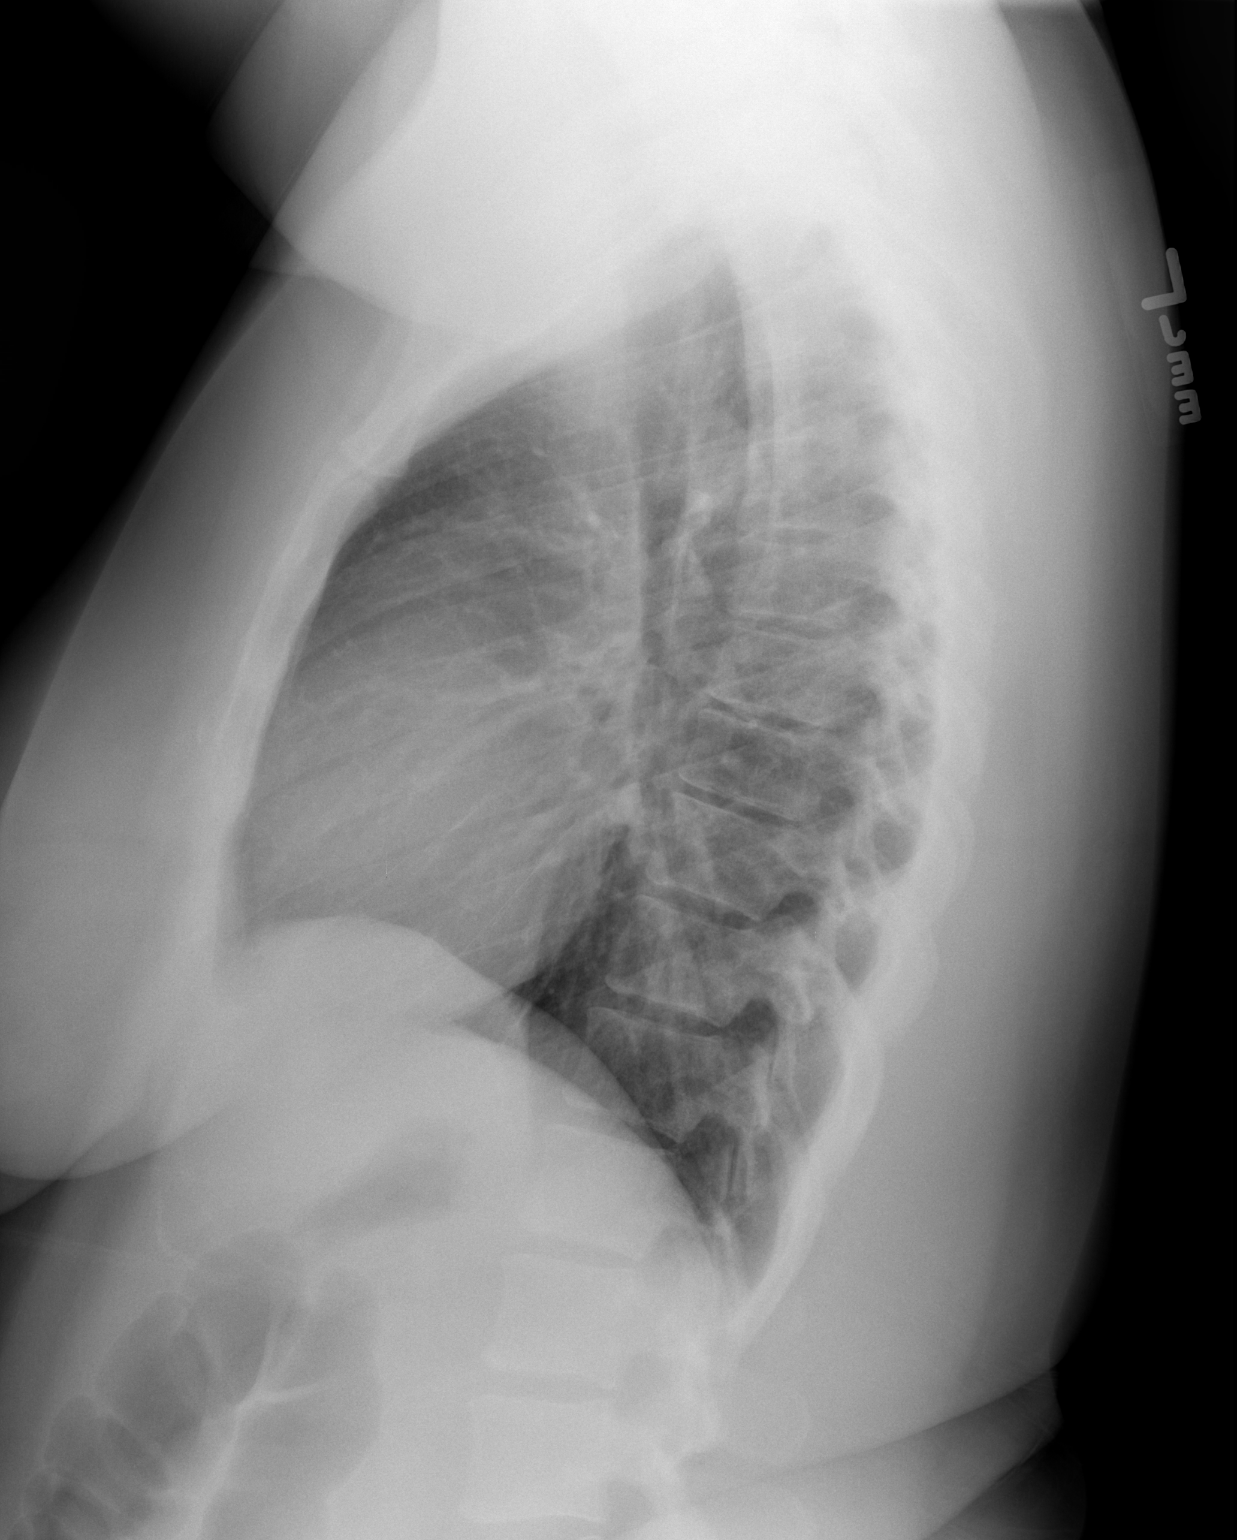

[2 of 2 positions shown; findings below may reference images not displayed]

FINDINGS: The heart size and mediastinal contours are normal. The lungs are
clear. There is no pleural effusion or pneumothorax. No acute
osseous findings are identified.
IMPRESSION: No active cardiopulmonary process.

## 2016-09-20 ENCOUNTER — Ambulatory Visit (HOSPITAL_COMMUNITY)
Admission: EM | Admit: 2016-09-20 | Discharge: 2016-09-20 | Disposition: A | Discharge status: Home / Self Care | Payer: 59 | Attending: Internal Medicine | Admitting: Internal Medicine

## 2016-09-20 ENCOUNTER — Encounter (HOSPITAL_COMMUNITY): Payer: Self-pay | Admitting: Family Medicine

## 2016-09-20 DIAGNOSIS — M5416 Radiculopathy, lumbar region: Secondary | ICD-10-CM | POA: Diagnosis not present

## 2016-09-20 NOTE — ED Triage Notes (Signed)
Pt here with a few days of RLE pain and numbness in extremity. sts that yesterday she started having pain in right arm, more in hand and numbness. Denies any injury. Denies any neck or back pain. Denies any vision problems.

## 2016-09-20 NOTE — ED Provider Notes (Signed)
CSN: [id number]     Arrival date & time 09/20/16  1307 History   First MD Initiated Contact with Patient 09/20/16 1325     No chief complaint on file.  (Consider location/radiation/quality/duration/timing/severity/associated sxs/prior Treatment) HPI History obtained from patient:  Pt presents with the cc of:  Right leg numbness Duration of symptoms: Over 2 weeks Treatment prior to arrival: No significant treatment Context: Patient states that over the last couple weeks she has noticed numbness in her right leg mostly when she is at work sitting in a chair. She states that she finds herself getting up frequently to keep her leg from going to sleep. States pain started in her back and radiates down her right leg. Other symptoms include: Burning sensation in the right leg and back.  Pain score: 2 or 3 FAMILY HISTORY: Hypertension    No past medical history on file. Past Surgical History:  Procedure Laterality Date  . CESAREAN SECTION     No family history on file. Social History  Substance Use Topics  . Smoking status: Never Smoker  . Smokeless tobacco: Never Used  . Alcohol use Yes   OB History    No data available     Review of Systems  Denies: HEADACHE, NAUSEA, ABDOMINAL PAIN, CHEST PAIN, CONGESTION, DYSURIA, SHORTNESS OF BREATH  Allergies  Darvon [propoxyphene]  Home Medications   Prior to Admission medications   Medication Sig Start Date End Date Taking? Authorizing Provider  fluticasone (FLONASE) 50 MCG/ACT nasal spray Place 2 sprays into both nostrils daily. 07/20/14   Mercedes Camprubi-Soms, PA-C  naproxen (NAPROSYN) 500 MG tablet Take 1 tablet (500 mg total) by mouth 2 (two) times daily as needed for mild pain, moderate pain or headache (TAKE WITH MEALS.). 07/20/14   Mercedes Camprubi-Soms, PA-C   Meds Ordered and Administered this Visit  Medications - No data to display  BP 121/83 (BP Location: Left Arm)   Pulse 79   Temp 99.1 F (37.3 C) (Oral)   Resp 16    SpO2 100%  No data found.   Physical Exam NURSES NOTES AND VITAL SIGNS REVIEWED. CONSTITUTIONAL: Well developed, well nourished, no acute distress HEENT: normocephalic, atraumatic EYES: Conjunctiva normal NECK:normal ROM, supple, no adenopathy PULMONARY:No respiratory distress, normal effort ABDOMINAL: Soft, ND, NT BS+, No CVAT MUSCULOSKELETAL: Normal ROM of all extremities, the patient does have tenderness and some mild spasm noted in the right lumbar area radiating into the right sciatic region. Straight leg raises are negative. Sensorimotor function are intact distally patient has no anesthesia SKIN: warm and dry without rash PSYCHIATRIC: Mood and affect, behavior are normal  Urgent Care Course   Clinical Course    Procedures (including critical care time)  Labs Review Labs Reviewed - No data to display  Imaging Review No results found.   Visual Acuity Review  Right Eye Distance:   Left Eye Distance:   Bilateral Distance:    Right Eye Near:   Left Eye Near:    Bilateral Near:        Pt is advised to be seen by provider that can obtain MR of back.  MDM   1. Lumbar radiculopathy     Patient is reassured that there are no issues that require transfer to higher level of care at this time or additional tests. Patient is advised to continue home symptomatic treatment. Patient is advised that if there are new or worsening symptoms to attend the emergency department, contact primary care provider, or return to UC.  Instructions of care provided discharged home in stable condition.    THIS NOTE WAS GENERATED USING A VOICE RECOGNITION SOFTWARE PROGRAM. ALL REASONABLE EFFORTS  WERE MADE TO PROOFREAD THIS DOCUMENT FOR ACCURACY.  I have verbally reviewed the discharge instructions with the patient. A printed AVS was given to the patient.  All questions were answered prior to discharge.      Tharon AquasFrank C Patrick, PA 09/20/16 1422

## 2016-09-20 NOTE — Discharge Instructions (Signed)
Hot soaks  Ibuprofen  Activity as tolerated.   Weight loss  Chiropractor manipulation

## 2017-09-17 ENCOUNTER — Inpatient Hospital Stay (HOSPITAL_COMMUNITY)
Admission: AD | Admit: 2017-09-17 | Discharge: 2017-09-17 | Disposition: A | Discharge status: Home / Self Care | Payer: 59 | Source: Ambulatory Visit | Attending: Obstetrics & Gynecology | Admitting: Obstetrics & Gynecology

## 2017-09-17 ENCOUNTER — Encounter (HOSPITAL_COMMUNITY): Payer: Self-pay

## 2017-09-17 DIAGNOSIS — B3731 Acute candidiasis of vulva and vagina: Secondary | ICD-10-CM

## 2017-09-17 DIAGNOSIS — R300 Dysuria: Secondary | ICD-10-CM | POA: Insufficient documentation

## 2017-09-17 DIAGNOSIS — Z87440 Personal history of urinary (tract) infections: Secondary | ICD-10-CM | POA: Diagnosis not present

## 2017-09-17 DIAGNOSIS — B373 Candidiasis of vulva and vagina: Secondary | ICD-10-CM | POA: Insufficient documentation

## 2017-09-17 DIAGNOSIS — R102 Pelvic and perineal pain: Secondary | ICD-10-CM | POA: Diagnosis present

## 2017-09-17 DIAGNOSIS — N898 Other specified noninflammatory disorders of vagina: Secondary | ICD-10-CM | POA: Diagnosis present

## 2017-09-17 DIAGNOSIS — A599 Trichomoniasis, unspecified: Secondary | ICD-10-CM | POA: Insufficient documentation

## 2017-09-17 LAB — URINALYSIS, ROUTINE W REFLEX MICROSCOPIC
BILIRUBIN URINE: NEGATIVE
GLUCOSE, UA: NEGATIVE mg/dL
Hgb urine dipstick: NEGATIVE
Ketones, ur: NEGATIVE mg/dL
Nitrite: NEGATIVE
Protein, ur: NEGATIVE mg/dL
Specific Gravity, Urine: 1.026 (ref 1.005–1.030)
pH: 5 (ref 5.0–8.0)

## 2017-09-17 LAB — WET PREP, GENITAL
Clue Cells Wet Prep HPF POC: NONE SEEN
Sperm: NONE SEEN
Yeast Wet Prep HPF POC: NONE SEEN

## 2017-09-17 LAB — POCT PREGNANCY, URINE: Preg Test, Ur: NEGATIVE

## 2017-09-17 LAB — GLUCOSE, CAPILLARY: Glucose-Capillary: 96 mg/dL (ref 65–99)

## 2017-09-17 MED ORDER — METRONIDAZOLE 500 MG PO TABS: 2000.0000 mg | ORAL_TABLET | Freq: Once | ORAL | 0 refills | Status: AC

## 2017-09-17 MED ORDER — FLUCONAZOLE 150 MG PO TABS: 150.0000 mg | ORAL_TABLET | Freq: Once | ORAL | 0 refills | Status: AC

## 2017-09-17 MED ORDER — FLUCONAZOLE 150 MG PO TABS: 150.0000 mg | ORAL_TABLET | Freq: Once | ORAL | 0 refills | Status: DC

## 2017-09-17 NOTE — Discharge Instructions (Signed)

## 2017-09-17 NOTE — MAU Provider Note (Signed)
Chief Complaint: Vaginal Discharge and Pelvic Pain   SUBJECTIVE HPI: Ashlee Andrews is a 32 y.o. G1P1001 who presents to MAU with vaginal discharge and soreness. Patient states that she has had vaginal discharge for the last 2 weeks. She originally treated it as a yeast infection and took OTC Monistat for symptoms. She had improvement of symptoms for about 3 days but then started having dysuria. She tried treating that with OTC AZO but without improvement. She states that starting Sunday her vulva area was raw, sore, and irritated. She made an appointment at her OBGYN office but had to come into MAU sooner because of her symptoms. Of note, patient went to urgent care about 6 days ago for dysuria. Was treated for a UTI with antibiotics for 5 days. She completed her course of antibiotics yesterday. Patient denies fever, blood in urine, n/v, abdominal pain.    History reviewed. No pertinent past medical history. OB History  Gravida Para Term Preterm AB Living  SAB TAB Ectopic Multiple Live Births          1    # Outcome Date GA Lbr Len/2nd Weight Sex Delivery Anes PTL Lv  1 Term      CS-LTranv   LIV     Past Surgical History:  Procedure Laterality Date  . CESAREAN SECTION     Social History   Social History  . Marital status: Single    Spouse name: N/A  . Number of children: N/A  . Years of education: N/A   Occupational History  . Not on file.   Social History Main Topics  . Smoking status: Never Smoker  . Smokeless tobacco: Never Used  . Alcohol use Yes  . Drug use: No  . Sexual activity: Yes    Birth control/ protection: None   Other Topics Concern  . Not on file   Social History Narrative  . No narrative on file   No current facility-administered medications on file prior to encounter.    Current Outpatient Prescriptions on File Prior to Encounter  Medication Sig Dispense Refill  . fluticasone (FLONASE) 50 MCG/ACT nasal spray Place 2 sprays into both  nostrils daily. 16 g 0  . naproxen (NAPROSYN) 500 MG tablet Take 1 tablet (500 mg total) by mouth 2 (two) times daily as needed for mild pain, moderate pain or headache (TAKE WITH MEALS.). 20 tablet 0   Allergies  Allergen Reactions  . Darvon [Propoxyphene] Hives    I have reviewed the past Medical Hx, Surgical Hx, Social Hx, Allergies and Medications.   REVIEW OF SYSTEMS All systems reviewed and are negative for acute change except as noted in the HPI.   OBJECTIVE BP 120/80   Pulse 96   Temp 98.6 F (37 C)   Resp 16   LMP 08/23/2017   PHYSICAL EXAM Constitutional: Well-developed, well-nourished female in no acute distress.  Cardiovascular: normal rate and rhythm, pulses intact Respiratory: normal rate and effort.  GI: Abd soft, non-tender, non-distended. MS: Extremities nontender, no edema, normal ROM Neurologic: Alert and oriented x 4. No focal deficits GU: Neg CVAT. SPECULUM EXAM: desquamation of vulva with erythema and white spots, + discharge, no blood noted, cervix clean, exquisitely tender to exam BIMANUAL: uterus normal size, no adnexal tenderness or masses. No CMT.  LAB RESULTS Results for orders placed or performed during the hospital encounter of 09/17/17 (from the past 24 hour(s))  Urinalysis, Routine w reflex microscopic  Status: Abnormal   Collection Time: 09/17/17  2:55 PM  Result Value Ref Range   Color, Urine YELLOW YELLOW   APPearance HAZY (A) CLEAR   Specific Gravity, Urine 1.026 1.005 - 1.030   pH 5.0 5.0 - 8.0   Glucose, UA NEGATIVE NEGATIVE mg/dL   Hgb urine dipstick NEGATIVE NEGATIVE   Bilirubin Urine NEGATIVE NEGATIVE   Ketones, ur NEGATIVE NEGATIVE mg/dL   Protein, ur NEGATIVE NEGATIVE mg/dL   Nitrite NEGATIVE NEGATIVE   Leukocytes, UA LARGE (A) NEGATIVE   RBC / HPF 0-5 0 - 5 RBC/hpf   WBC, UA TOO NUMEROUS TO COUNT 0 - 5 WBC/hpf   Bacteria, UA RARE (A) NONE SEEN   Squamous Epithelial / LPF 0-5 (A) NONE SEEN   Mucus PRESENT   Pregnancy,  urine POC     Status: None   Collection Time: 09/17/17  3:19 PM  Result Value Ref Range   Preg Test, Ur NEGATIVE NEGATIVE  Wet prep, genital     Status: Abnormal   Collection Time: 09/17/17  3:27 PM  Result Value Ref Range   Yeast Wet Prep HPF POC NONE SEEN NONE SEEN   Trich, Wet Prep PRESENT (A) NONE SEEN   Clue Cells Wet Prep HPF POC NONE SEEN NONE SEEN   WBC, Wet Prep HPF POC MODERATE (A) NONE SEEN   Sperm NONE SEEN   Glucose, capillary     Status: None   Collection Time: 09/17/17  3:36 PM  Result Value Ref Range   Glucose-Capillary 96 65 - 99 mg/dL    IMAGING No results found.  MAU COURSE Vitals and nursing notes reviewed I have ordered labs and reviewed them Pelvic exam consistent with vulvovaginitis from candida Wet prep positive for trichomoniasis UA with signs of UTI >recently treated POCT CBG 96  Upreg negative  MDM Plan of care reviewed with patient, including labs and tests ordered and medical treatment.   ASSESSMENT/PLAN 1. Candidal vulvovaginitis   2. Trichimoniasis   3. Dysuria    Rx given for fluconazole x2 Rx also given for Flagyl 2g for Trich infection GC/Chl swabs pending Urine sent for culture; will hold off on treating for UTI since she just completed Abx and Trich can be causing the dysuria Discharge home in stable condition. Follow-up with her provider Handout given   Caryl Ada, DO OB [facility], [facility] 09/17/2017, 3:13 PM

## 2017-09-17 NOTE — MAU Note (Addendum)
Having vaginal discharge, took monistat for it and was clearing up. Recent burning with urination and says it feels like it's blistering her skin. Went to urgent care last week and did a urine specimen but didn't give results, put on antibiotic.

## 2017-09-18 LAB — GC/CHLAMYDIA PROBE AMP (~~LOC~~) NOT AT ARMC
Chlamydia: NEGATIVE
Neisseria Gonorrhea: NEGATIVE

## 2017-09-18 LAB — URINE CULTURE: Culture: NO GROWTH

## 2017-10-10 ENCOUNTER — Other Ambulatory Visit: Payer: Self-pay | Admitting: Obstetrics and Gynecology
